# Patient Record
Sex: Male | Born: 1955
Health system: Southern US, Community
[De-identification: ages and names within clinical notes are randomized; demographics above are authoritative.]

## PROBLEM LIST (undated history)

## (undated) HISTORY — PX: HERNIA REPAIR: SHX51

---

## 2007-10-29 ENCOUNTER — Emergency Department (HOSPITAL_COMMUNITY): Admission: EM | Admit: 2007-10-29 | Discharge: 2007-10-29 | Payer: Self-pay | Admitting: Emergency Medicine

## 2008-03-25 ENCOUNTER — Encounter: Admission: RE | Admit: 2008-03-25 | Discharge: 2008-03-25 | Payer: Self-pay | Admitting: Internal Medicine

## 2011-06-28 ENCOUNTER — Emergency Department: Payer: Self-pay | Admitting: Unknown Physician Specialty

## 2011-06-28 LAB — COMPREHENSIVE METABOLIC PANEL
Calcium, Total: 8.9 mg/dL (ref 8.5–10.1)
EGFR (Non-African Amer.): 34 — ABNORMAL LOW
Glucose: 81 mg/dL (ref 65–99)
SGOT(AST): 27 U/L (ref 15–37)
SGPT (ALT): 23 U/L
Total Protein: 8.1 g/dL (ref 6.4–8.2)

## 2011-06-28 LAB — URINALYSIS, COMPLETE
Bacteria: NONE SEEN
Cellular Cast: 1
Glucose,UR: NEGATIVE mg/dL (ref 0–75)
Hyaline Cast: 36
Ketone: NEGATIVE
Leukocyte Esterase: NEGATIVE
RBC,UR: 1 /HPF (ref 0–5)
Squamous Epithelial: 1
WBC UR: 2 /HPF (ref 0–5)

## 2011-06-28 LAB — CBC
HCT: 48.2 % (ref 40.0–52.0)
Platelet: 308 10*3/uL (ref 150–440)
RBC: 5.17 10*6/uL (ref 4.40–5.90)

## 2011-06-28 LAB — CK TOTAL AND CKMB (NOT AT ARMC): CK, Total: 403 U/L — ABNORMAL HIGH (ref 35–232)

## 2015-10-29 ENCOUNTER — Emergency Department: Payer: Self-pay

## 2015-10-29 ENCOUNTER — Inpatient Hospital Stay
Admission: EM | Admit: 2015-10-29 | Discharge: 2015-10-30 | DRG: 871 | Disposition: A | Discharge status: Home / Self Care | Payer: Self-pay | Attending: Internal Medicine | Admitting: Internal Medicine

## 2015-10-29 ENCOUNTER — Encounter: Payer: Self-pay | Admitting: Urgent Care

## 2015-10-29 DIAGNOSIS — Z9889 Other specified postprocedural states: Secondary | ICD-10-CM

## 2015-10-29 DIAGNOSIS — F17210 Nicotine dependence, cigarettes, uncomplicated: Secondary | ICD-10-CM | POA: Diagnosis present

## 2015-10-29 DIAGNOSIS — A419 Sepsis, unspecified organism: Principal | ICD-10-CM | POA: Diagnosis present

## 2015-10-29 DIAGNOSIS — R079 Chest pain, unspecified: Secondary | ICD-10-CM

## 2015-10-29 DIAGNOSIS — Z716 Tobacco abuse counseling: Secondary | ICD-10-CM

## 2015-10-29 DIAGNOSIS — J181 Lobar pneumonia, unspecified organism: Secondary | ICD-10-CM

## 2015-10-29 DIAGNOSIS — I2699 Other pulmonary embolism without acute cor pulmonale: Secondary | ICD-10-CM | POA: Diagnosis present

## 2015-10-29 DIAGNOSIS — R0602 Shortness of breath: Secondary | ICD-10-CM

## 2015-10-29 DIAGNOSIS — R0682 Tachypnea, not elsewhere classified: Secondary | ICD-10-CM | POA: Diagnosis present

## 2015-10-29 DIAGNOSIS — J189 Pneumonia, unspecified organism: Secondary | ICD-10-CM | POA: Diagnosis present

## 2015-10-29 LAB — URINE DRUG SCREEN, QUALITATIVE (ARMC ONLY)
Amphetamines, Ur Screen: NOT DETECTED
Barbiturates, Ur Screen: NOT DETECTED
Benzodiazepine, Ur Scrn: NOT DETECTED
CANNABINOID 50 NG, UR ~~LOC~~: NOT DETECTED
Cocaine Metabolite,Ur ~~LOC~~: POSITIVE — AB
MDMA (ECSTASY) UR SCREEN: NOT DETECTED
Methadone Scn, Ur: NOT DETECTED
OPIATE, UR SCREEN: POSITIVE — AB
PHENCYCLIDINE (PCP) UR S: NOT DETECTED
Tricyclic, Ur Screen: NOT DETECTED

## 2015-10-29 LAB — FIBRIN DERIVATIVES D-DIMER (ARMC ONLY): FIBRIN DERIVATIVES D-DIMER (ARMC): 3824 — AB (ref 0–499)

## 2015-10-29 LAB — CBC WITH DIFFERENTIAL/PLATELET
BASOS ABS: 0 10*3/uL (ref 0–0.1)
BASOS PCT: 0 %
EOS ABS: 0.2 10*3/uL (ref 0–0.7)
Eosinophils Relative: 1 %
HCT: 39.4 % — ABNORMAL LOW (ref 40.0–52.0)
HEMOGLOBIN: 13.6 g/dL (ref 13.0–18.0)
Lymphocytes Relative: 12 %
Lymphs Abs: 1.8 10*3/uL (ref 1.0–3.6)
MCH: 31.4 pg (ref 26.0–34.0)
MCHC: 34.6 g/dL (ref 32.0–36.0)
MCV: 90.8 fL (ref 80.0–100.0)
MONOS PCT: 12 %
Monocytes Absolute: 1.8 10*3/uL — ABNORMAL HIGH (ref 0.2–1.0)
NEUTROS ABS: 11.1 10*3/uL — AB (ref 1.4–6.5)
NEUTROS PCT: 75 %
PLATELETS: 240 10*3/uL (ref 150–440)
RBC: 4.34 MIL/uL — ABNORMAL LOW (ref 4.40–5.90)
RDW: 13.7 % (ref 11.5–14.5)
WBC: 15 10*3/uL — AB (ref 3.8–10.6)

## 2015-10-29 LAB — COMPREHENSIVE METABOLIC PANEL
ALBUMIN: 3.5 g/dL (ref 3.5–5.0)
ALT: 12 U/L — ABNORMAL LOW (ref 17–63)
ANION GAP: 6 (ref 5–15)
AST: 19 U/L (ref 15–41)
Alkaline Phosphatase: 62 U/L (ref 38–126)
BILIRUBIN TOTAL: 0.2 mg/dL — AB (ref 0.3–1.2)
BUN: 8 mg/dL (ref 6–20)
CALCIUM: 8.5 mg/dL — AB (ref 8.9–10.3)
CO2: 25 mmol/L (ref 22–32)
Chloride: 108 mmol/L (ref 101–111)
Creatinine, Ser: 1.05 mg/dL (ref 0.61–1.24)
Glucose, Bld: 114 mg/dL — ABNORMAL HIGH (ref 65–99)
POTASSIUM: 4 mmol/L (ref 3.5–5.1)
Sodium: 139 mmol/L (ref 135–145)
TOTAL PROTEIN: 6.5 g/dL (ref 6.5–8.1)

## 2015-10-29 LAB — TROPONIN I: Troponin I: <0.03 ng/mL

## 2015-10-29 MED ORDER — IOPAMIDOL (ISOVUE-370) INJECTION 76%
75.0000 mL | Freq: Once | INTRAVENOUS | Status: AC | PRN
  Administered 2015-10-29: 75 mL via INTRAVENOUS

## 2015-10-29 MED ORDER — ACETAMINOPHEN 325 MG PO TABS: 650.0000 mg | ORAL_TABLET | Freq: Four times a day (QID) | ORAL | Status: DC | PRN

## 2015-10-29 MED ORDER — APIXABAN 5 MG PO TABS
10.0000 mg | ORAL_TABLET | Freq: Two times a day (BID) | ORAL | Status: DC
  Administered 2015-10-29 – 2015-10-30 (×3): 10 mg via ORAL
  Filled 2015-10-29 (×3): qty 2

## 2015-10-29 MED ORDER — PNEUMOCOCCAL VAC POLYVALENT 25 MCG/0.5ML IJ INJ: 0.5000 mL | INJECTION | INTRAMUSCULAR | Status: DC

## 2015-10-29 MED ORDER — APIXABAN 5 MG PO TABS: 5.0000 mg | ORAL_TABLET | Freq: Two times a day (BID) | ORAL | Status: DC

## 2015-10-29 MED ORDER — DEXTROSE 5 % IV SOLN
1.0000 g | INTRAVENOUS | Status: DC
  Administered 2015-10-30: 1 g via INTRAVENOUS
  Filled 2015-10-29: qty 10

## 2015-10-29 MED ORDER — HYDROCODONE-ACETAMINOPHEN 5-325 MG PO TABS
1.0000 | ORAL_TABLET | ORAL | Status: DC | PRN
  Administered 2015-10-29 (×2): 2 via ORAL
  Filled 2015-10-29 (×2): qty 2

## 2015-10-29 MED ORDER — SODIUM CHLORIDE 0.9% FLUSH
3.0000 mL | Freq: Two times a day (BID) | INTRAVENOUS | Status: DC
  Administered 2015-10-29 – 2015-10-30 (×3): 3 mL via INTRAVENOUS

## 2015-10-29 MED ORDER — ONDANSETRON HCL 4 MG PO TABS: 4.0000 mg | ORAL_TABLET | Freq: Four times a day (QID) | ORAL | Status: DC | PRN

## 2015-10-29 MED ORDER — ACETAMINOPHEN 650 MG RE SUPP: 650.0000 mg | Freq: Four times a day (QID) | RECTAL | Status: DC | PRN

## 2015-10-29 MED ORDER — CEFTRIAXONE SODIUM IN DEXTROSE 20 MG/ML IV SOLN
1.0000 g | Freq: Once | INTRAVENOUS | Status: AC
  Administered 2015-10-29: 1 g via INTRAVENOUS
  Filled 2015-10-29: qty 50

## 2015-10-29 MED ORDER — INFLUENZA VAC SPLIT QUAD 0.5 ML IM SUSY: 0.5000 mL | PREFILLED_SYRINGE | INTRAMUSCULAR | Status: DC

## 2015-10-29 MED ORDER — MORPHINE SULFATE (PF) 4 MG/ML IV SOLN
4.0000 mg | Freq: Once | INTRAVENOUS | Status: AC
Start: 2015-10-29 — End: 2015-10-29
  Administered 2015-10-29: 4 mg via INTRAVENOUS
  Filled 2015-10-29: qty 1

## 2015-10-29 MED ORDER — MAGNESIUM HYDROXIDE 400 MG/5ML PO SUSP
30.0000 mL | Freq: Every day | ORAL | Status: DC | PRN
Start: 2015-10-29 — End: 2015-10-30

## 2015-10-29 MED ORDER — DEXTROSE 5 % IV SOLN
500.0000 mg | Freq: Once | INTRAVENOUS | Status: AC
  Administered 2015-10-29: 500 mg via INTRAVENOUS
  Filled 2015-10-29: qty 500

## 2015-10-29 MED ORDER — DEXTROSE 5 % IV SOLN
500.0000 mg | INTRAVENOUS | Status: DC
  Administered 2015-10-30: 500 mg via INTRAVENOUS
  Filled 2015-10-29: qty 500

## 2015-10-29 MED ORDER — SODIUM CHLORIDE 0.9 % IV BOLUS (SEPSIS)
500.0000 mL | Freq: Once | INTRAVENOUS | Status: AC
  Administered 2015-10-29: 500 mL via INTRAVENOUS

## 2015-10-29 MED ORDER — ONDANSETRON HCL 4 MG/2ML IJ SOLN: 4.0000 mg | Freq: Four times a day (QID) | INTRAMUSCULAR | Status: DC | PRN

## 2015-10-29 MED ORDER — ONDANSETRON HCL 4 MG/2ML IJ SOLN
4.0000 mg | Freq: Once | INTRAMUSCULAR | Status: AC
  Administered 2015-10-29: 4 mg via INTRAVENOUS
  Filled 2015-10-29: qty 2

## 2015-10-29 MED ORDER — SODIUM CHLORIDE 0.9 % IV BOLUS (SEPSIS)
1000.0000 mL | Freq: Once | INTRAVENOUS | Status: AC
  Administered 2015-10-29: 1000 mL via INTRAVENOUS

## 2015-10-29 NOTE — Consult Note (Signed)
ANTICOAGULATION CONSULT NOTE - Initial Consult  Pharmacy Consult for apixaban Indication: pulmonary embolus  No Known Allergies  Patient Measurements: Height: 6\' 1"  (185.4 cm) Weight: 165 lb (74.8 kg) IBW/kg (Calculated) : 79.9 Heparin Dosing Weight:   Vital Signs: Temp: 98.3 F (36.8 C) (10/14 0554) Temp Source: Oral (10/14 0554) BP: 126/82 (10/14 1100) Pulse Rate: 68 (10/14 1100)  Labs:  Recent Labs  10/29/15 0541  HGB 13.6  HCT 39.4*  PLT 240  CREATININE 1.05  TROPONINI <0.03    Estimated Creatinine Clearance: 77.2 mL/min (by C-G formula based on SCr of 1.05 mg/dL).   Medical History: History reviewed. No pertinent past medical history.  Medications:  Scheduled:  . apixaban  10 mg Oral BID   Followed by  . [START ON 11/05/2015] apixaban  5 mg Oral BID  . sodium chloride flush  3 mL Intravenous Q12H    Assessment: Pt is a 60 year old male who presents with SOB found to have PNA and a small PE on CT scan Goal of Therapy:   Monitor platelets by anticoagulation protocol: Yes   Plan:  apixaban 10mg  BID x 7 days then 5mg  BID.  Check CBC/SCr q 3 days while inpatient.  Olene FlossMelissa D Vena Bassinger, Pharm.D Clinical Pharmacist  10/29/2015,11:18 AM

## 2015-10-29 NOTE — ED Notes (Signed)
Iv present on this RN's intial assessment

## 2015-10-29 NOTE — ED Notes (Signed)
Lab resulting at this time. D-dimer elevated; MD made aware. Inocencio HomesGayle, MD electing not to pursuing a CT scan due to patient having a LLL PNA and due to the fact that he has been coughing over the course of the last few days. Patient to be treated for CAP; will forgo the CT scan.

## 2015-10-29 NOTE — ED Triage Notes (Signed)
Patient presents to ED via EMS from home. Patient with c/o LEFT chest wall pain that began yesterday; worse this morning. (+) recent cough reported. Patient is guarding; pain reproducible with movement and deep inspiration. Patient endorses recent crack cocaine use x 3-4 days ago.

## 2015-10-29 NOTE — Progress Notes (Signed)
Pt stated to RN that he is having left sided chest pain that he felt earlier in the ED, VSS. RN notified Dr. Juliene PinaMody about pt.'s current condition, new orders to place troponin lab draw once and to give PRN Norco to pt at this time. Will continue to monitor pt closely.   Karsten RoLauren E Hobbs

## 2015-10-29 NOTE — H&P (Signed)
[facility] [place] [facility]   PATIENT NAME: Joshua Clay    MR#:  [id number]  DATE OF BIRTH:  01/07/1956  DATE OF ADMISSION:  10/29/2015  PRIMARY CARE PHYSICIAN: No primary care provider on file.   REQUESTING/REFERRING PHYSICIAN: Dr Darnelle CatalanMalinda  CHIEF COMPLAINT:    Shortness of breath HISTORY OF PRESENT ILLNESS:  Joshua Clay  is a 60 y.o. male with History of tobacco dependence who presents with above complaint. Patient presented past 2 days he has had increasing shortness of breath and chest pain on the left side. He denies fever, cough or shortness of breath. He denies recent travel history or lower extremity edema. In the ER he is found to have pneumonia as well as small pulmonary emboli and CT scan.  PAST MEDICAL HISTORY:  Tobacco dependence  PAST SURGICAL HISTORY:   Past Surgical History:  Procedure Laterality Date  . HERNIA REPAIR      SOCIAL HISTORY:   Social History  Substance Use Topics  . Smoking status: Current Every Day Smoker  . Smokeless tobacco: Never Used  . Alcohol use No    FAMILY HISTORY:  No history of CVA or pulmonary emboli  DRUG ALLERGIES:  No Known Allergies  REVIEW OF SYSTEMS:   Review of Systems  Constitutional: Negative.  Negative for chills, fever and malaise/fatigue.  HENT: Negative.  Negative for ear discharge, ear pain, hearing loss, nosebleeds and sore throat.   Eyes: Negative.  Negative for blurred vision and pain.  Respiratory: Positive for cough and shortness of breath. Negative for hemoptysis and wheezing.   Cardiovascular: Positive for chest pain. Negative for palpitations and leg swelling.  Gastrointestinal: Negative.  Negative for abdominal pain, blood in stool, diarrhea, nausea and vomiting.  Genitourinary: Negative.  Negative for dysuria.  Musculoskeletal: Negative.  Negative for back pain.  Skin: Negative.   Neurological: Negative for dizziness, tremors, speech change, focal weakness, seizures  and headaches.  Endo/Heme/Allergies: Negative.  Does not bruise/bleed easily.  Psychiatric/Behavioral: Negative.  Negative for depression, hallucinations and suicidal ideas.    MEDICATIONS AT HOME:   Prior to Admission medications   Medication Sig Start Date End Date Taking? Authorizing Provider  acetaminophen (TYLENOL) 500 MG tablet Take 500 mg by mouth every 6 (six) hours as needed.   Yes Historical Provider, MD      VITAL SIGNS:  Blood pressure (!) 141/99, pulse 87, temperature 98.3 F (36.8 C), temperature source Oral, resp. rate (!) 26, height 6\' 1"  (1.854 m), weight 74.8 kg (165 lb), SpO2 95 %.  PHYSICAL EXAMINATION:   Physical Exam  Constitutional: He is oriented to person, place, and time and well-developed, well-nourished, and in no distress. No distress.  HENT:  Head: Normocephalic.  Eyes: No scleral icterus.  Neck: Normal range of motion. Neck supple. No JVD present. No tracheal deviation present.  Cardiovascular: Normal rate, regular rhythm and normal heart sounds.  Exam reveals no gallop and no friction rub.   No murmur heard. Pulmonary/Chest: Effort normal and breath sounds normal. No respiratory distress. He has no wheezes. He has no rales. He exhibits no tenderness.  Abdominal: Soft. Bowel sounds are normal. He exhibits no distension and no mass. There is no tenderness. There is no rebound and no guarding.  Musculoskeletal: Normal range of motion. He exhibits no edema.  Neurological: He is alert and oriented to person, place, and time.  Skin: Skin is warm. No rash noted. No erythema.  Psychiatric: Affect and judgment normal.  LABORATORY PANEL:   CBC  Recent Labs Lab 10/29/15 0541  WBC 15.0*  HGB 13.6  HCT 39.4*  PLT 240   ------------------------------------------------------------------------------------------------------------------  Chemistries   Recent Labs Lab 10/29/15 0541  NA 139  K 4.0  CL 108  CO2 25  GLUCOSE 114*  BUN 8   CREATININE 1.05  CALCIUM 8.5*  AST 19  ALT 12*  ALKPHOS 62  BILITOT 0.2*   ------------------------------------------------------------------------------------------------------------------  Cardiac Enzymes  Recent Labs Lab 10/29/15 0541  TROPONINI <0.03   ------------------------------------------------------------------------------------------------------------------  RADIOLOGY:  Dg Chest 2 View  Result Date: 10/29/2015 CLINICAL DATA:  Left-sided chest pain.  Shortness of breath.  Cough. EXAM: CHEST  2 VIEW COMPARISON:  03/25/2008 FINDINGS: Peripheral left lung base opacity with small left pleural effusion. Minimal lingular opacity. Right lung is clear. Normal heart size and mediastinal contours. No pulmonary edema or pneumothorax. No acute osseous abnormality. IMPRESSION: Left lower lobe and lingular opacity with small left pleural effusion. Electronically Signed   By: Rubye Oaks M.D.   On: 10/29/2015 06:14   Ct Angio Chest Pe W Or Wo Contrast  Result Date: 10/29/2015 CLINICAL DATA:  60 year old male with a history of left-sided chest pain and shortness of breath starting 2 days prior. EXAM: CT ANGIOGRAPHY CHEST WITH CONTRAST TECHNIQUE: Multidetector CT imaging of the chest was performed using the standard protocol during bolus administration of intravenous contrast. Multiplanar CT image reconstructions and MIPs were obtained to evaluate the vascular anatomy. CONTRAST:  75 cc Isovue 370 COMPARISON:  None. FINDINGS: Cardiovascular: Heart: No cardiomegaly. No pericardial fluid/thickening. No significant coronary calcifications. Aorta: Unremarkable course, caliber, contour of the thoracic aorta. No aneurysm or dissection flap. No periaortic fluid. Minimal soft plaque with no calcifications. Pulmonary arteries: Small volume segmental and subsegmental pulmonary emboli the bilateral lower lobes. Minimal subsegmental volume of the upper lobes. Mediastinum/Nodes: Mediastinal lymph nodes  are present, none of which are enlarged by CT size criteria. Unremarkable appearance of the thoracic esophagus. Unremarkable appearance of the thoracic inlet and thyroid. Lungs/Pleura: Mixed ground-glass and nodular opacity of the left lower lobe with associated small pleural effusion. Atelectasis/scarring of the lingula. Nodular opacity at the right base within the costophrenic sulcus with associated ground-glass opacity. No pneumothorax. Upper Abdomen: Unremarkable. Musculoskeletal: No displaced fracture. Degenerative changes of the spine. Review of the MIP images confirms the above findings. IMPRESSION: Study is positive for small volume bilateral pulmonary emboli involving segmental and subsegmental vessels of mainly the lower lobes. There is associated mixed ground-glass and nodular opacities of the lungs compatible with developing atelectasis/infarction, with small left-sided pleural effusion. These results were called by telephone at the time of interpretation on 10/29/2015 at 8:55 am to Dr. Darnelle Catalan, who verbally acknowledged these results. Signed, Yvone Neu. Loreta Ave, DO [facility] Radiology Electronically Signed   By: Gilmer Mor D.O.   On: 10/29/2015 08:59    EKG:   Normal sinus rhythm heart rate 82 no ST elevation or depression  IMPRESSION AND PLAN:   60 year old male with a history of tobacco dependence who presents with cough, shortness of breath and chest pain and found to have pneumonia as well as bilateral pulmonary emboli.  1. Sepsis: Patient presents with tachypnea and leukocytosis. Sepsis is due to pneumonia.  2. Community-acquired pneumonia: Continue Rocephin and azithromycin.  3. Bilateral pulmonary emboli involving segmental and subsegmental vessels of mainly lower lobes: Start anticoagulation with Eliquis. Side effects, alternatives, risks and benefits were discussed with patient who accepts risks.  4. Tobacco is: Patient is  encouraged to stop smoking. He reports he wants to quit smoking. Patient counseled for 3 minutes. All the records are reviewed and case discussed with ED provider. Management plans discussed with the patient and he in agreement  CODE STATUS: full  TOTAL TIME TAKING CARE OF THIS PATIENT: 45 minutes.    Claire Bridge M.D on 10/29/2015 at 10:54 AM  Between 7am to 6pm - Pager - [phone]  After 6pm go to [email or web address] - Social research officer, government  [facility]  Office  [phone]  CC: Primary care physician; No primary care provider on file.

## 2015-10-29 NOTE — ED Notes (Signed)
Patient much more comfortable following Morphine dose. Dr. Inocencio HomesGayle returns to bedside to speak with patient regarding labs and CXR results.

## 2015-10-29 NOTE — ED Provider Notes (Addendum)
[facility] Emergency Department Provider Note   ____________________________________________   First MD Initiated Contact with Patient 10/29/15 [phone]     (approximate)  I have reviewed the triage vital signs and the nursing notes.   HISTORY  Chief Complaint Chest Pain and Shortness of Breath    HPI Joshua Clay is a 60 y.o. male with no chronic medical problems presents for evaluation of severe left-sided chest pain since yesterday, gradual onset, constant, worse with cough and deep inspiration. He has had productive cough as well as fevers and chills. Pain does not radiate to the back or down towards the feet. No vomiting or diarrhea.He denies history of DVT or PE.   History reviewed. No pertinent past medical history.  There are no active problems to display for this patient.   Past Surgical History:  Procedure Laterality Date  . HERNIA REPAIR      Prior to Admission medications   Not on File    Allergies Review of patient's allergies indicates no known allergies.  No family history on file.  Social History Social History  Substance Use Topics  . Smoking status: Current Every Day Smoker  . Smokeless tobacco: Never Used  . Alcohol use No    Review of Systems Constitutional: + fever/chills Eyes: No visual changes. ENT: No sore throat. Cardiovascular: + chest pain. Respiratory: + shortness of breath. Gastrointestinal: No abdominal pain.  No nausea, no vomiting.  No diarrhea.  No constipation. Genitourinary: Negative for dysuria. Musculoskeletal: Negative for back pain. Skin: Negative for rash. Neurological: Negative for headaches, focal weakness or numbness.  10-point ROS otherwise negative.  ____________________________________________   PHYSICAL EXAM:  VITAL SIGNS: ED Triage Vitals  Enc Vitals Group     BP 10/29/15 0554 (!) 141/88     Pulse Rate 10/29/15 0554 78     Resp 10/29/15 0554 (!) 22     Temp 10/29/15  0554 98.3 F (36.8 C)     Temp Source 10/29/15 0554 Oral     SpO2 10/29/15 0554 93 %     Weight 10/29/15 0555 165 lb (74.8 kg)     Height 10/29/15 0555 6\' 1"  (1.854 m)     Head Circumference --      Peak Flow --      Pain Score 10/29/15 0555 10     Pain Loc --      Pain Edu? --      Excl. in GC? --     Constitutional: Alert and oriented.In severe distress secondary to pain. Eyes: Conjunctivae are normal. PERRL. EOMI. Head: Atraumatic. Nose: No congestion/rhinnorhea. Mouth/Throat: Mucous membranes are moist.  Oropharynx non-erythematous. Neck: No stridor. Supple without meningismus. Cardiovascular: Normal rate, regular rhythm. Grossly normal heart sounds.  Good peripheral circulation. Respiratory: Tachypnea with mildly increased work of breathing, diminished breath sounds in the left lower lung fields. Gastrointestinal: Soft and nontender. No distention.  No CVA tenderness. Genitourinary: deferred Musculoskeletal: No lower extremity tenderness nor edema.  No joint effusions. Neurologic:  Normal speech and language. No gross focal neurologic deficits are appreciated. No gait instability. Skin:  Skin is warm, dry and intact. No rash noted. Psychiatric: Mood and affect are normal. Speech and behavior are normal.  ____________________________________________   LABS (all labs ordered are listed, but only abnormal results are displayed)  Labs Reviewed  COMPREHENSIVE METABOLIC PANEL - Abnormal; Notable for the following:       Result Value   Glucose, Bld 114 (*)    Calcium 8.5 (*)  ALT 12 (*)    Total Bilirubin 0.2 (*)    All other components within normal limits  FIBRIN DERIVATIVES D-DIMER ([facility] ONLY) - Abnormal; Notable for the following:    Fibrin derivatives D-dimer [facility]) 3,824 (*)    All other components within normal limits  CBC WITH DIFFERENTIAL/PLATELET - Abnormal; Notable for the following:    WBC 15.0 (*)    RBC 4.34 (*)    HCT 39.4 (*)    Neutro Abs 11.1 (*)     Monocytes Absolute 1.8 (*)    All other components within normal limits  CULTURE, BLOOD (ROUTINE X 2)  CULTURE, BLOOD (ROUTINE X 2)  TROPONIN I  URINE DRUG SCREEN, QUALITATIVE ([facility] ONLY)   ____________________________________________  EKG  ED ECG REPORT I, Gayla Doss, the attending physician, personally viewed and interpreted this ECG.   Date: 10/29/2015  EKG Time: 05:41  Rate: 82  Rhythm: normal sinus rhythm  Axis: normal  Intervals:none  ST&T Change: No acute ST elevation or acute ST depression. T-wave flattening in leads 3 and V1 is nonspecific.  ____________________________________________  RADIOLOGY  CXR FINDINGS:  Peripheral left lung base opacity with small left pleural effusion.  Minimal lingular opacity. Right lung is clear. Normal heart size and  mediastinal contours. No pulmonary edema or pneumothorax. No acute  osseous abnormality.    IMPRESSION:  Left lower lobe and lingular opacity with small left pleural  effusion.    ____________________________________________   PROCEDURES  Procedure(s) performed: None  Procedures  Critical Care performed: No  ____________________________________________   INITIAL IMPRESSION / ASSESSMENT AND PLAN / ED COURSE  Pertinent labs & imaging results that were available during my care of the patient were reviewed by me and considered in my medical decision making (see chart for details).  Joshua Clay is a 60 y.o. male with no chronic medical problems presents for evaluation of severe left-sided chest pain as well as cough and shortness of breath. On arrival to the emergency department he is in severe distress secondary to pain. He is tachypneic with increased work of breathing has diminished breath sounds in the left lung fields. We'll obtain screening labs, treat him symptomatically, obtain chest x-ray. Given the pleuritic nature of his pain and his age, cannot use perc rule, we'll obtain d-dimer.  Reassuring, not consistent with acute ischemia.  ----------------------------------------- 7:18 AM on 10/29/2015 ----------------------------------------- Labs reviewed and are notable for leukocytosis, CBC shows white blood cell count of 15. CMP generally unremarkable. Negative troponin. Chest x-ray confirms left lingular and left lower lobe opacity. Clinical picture is most consistent with community acquired pneumonia. His pain is improved at this time however while he is resting, his respiratory rate is still 27 respirations per minute and he gets very short of breath with minimal exertion. Given his leukocytosis and his persistent tachypnea, I ordered blood cultures, fluids, ceftriaxone and azithromycin for treatment of community acquired pneumonia. I discussed the case with Dr. Tobi Bastos for admission at this time. Of note, his d-dimer was elevated but his clinical picture is much more consistent with pneumonia as opposed to PE so I will not order a CT angio chest at this time. If his symptoms worsen or he is not responding appropriately to therapy, this can be ordered at the discretion of the inpatient/admitting team.   Clinical Course     ____________________________________________   FINAL CLINICAL IMPRESSION(S) / ED DIAGNOSES  Final diagnoses:  Chest pain, unspecified type  Community acquired pneumonia of left lower lobe of  lung (HCC)      NEW MEDICATIONS STARTED DURING THIS VISIT:  New Prescriptions   No medications on file     Note:  This document was prepared using Dragon voice recognition software and may include unintentional dictation errors.    Gayla Doss, MD 10/29/15 1610    Gayla Doss, MD 10/29/15 [phone]

## 2015-10-29 NOTE — ED Notes (Signed)
Patient to CT.

## 2015-10-30 LAB — CBC
HCT: 37.6 % — ABNORMAL LOW (ref 40.0–52.0)
HEMOGLOBIN: 12.9 g/dL — AB (ref 13.0–18.0)
MCH: 31.5 pg (ref 26.0–34.0)
MCHC: 34.3 g/dL (ref 32.0–36.0)
MCV: 92 fL (ref 80.0–100.0)
Platelets: 231 10*3/uL (ref 150–440)
RBC: 4.08 MIL/uL — ABNORMAL LOW (ref 4.40–5.90)
RDW: 13.8 % (ref 11.5–14.5)
WBC: 9.3 10*3/uL (ref 3.8–10.6)

## 2015-10-30 LAB — BASIC METABOLIC PANEL
ANION GAP: 5 (ref 5–15)
BUN: 7 mg/dL (ref 6–20)
CALCIUM: 8.2 mg/dL — AB (ref 8.9–10.3)
CO2: 25 mmol/L (ref 22–32)
Chloride: 108 mmol/L (ref 101–111)
Creatinine, Ser: 0.96 mg/dL (ref 0.61–1.24)
GLUCOSE: 142 mg/dL — AB (ref 65–99)
Potassium: 3.5 mmol/L (ref 3.5–5.1)
SODIUM: 138 mmol/L (ref 135–145)

## 2015-10-30 MED ORDER — APIXABAN 5 MG PO TABS: 10.0000 mg | ORAL_TABLET | Freq: Two times a day (BID) | ORAL | 0 refills | Status: AC

## 2015-10-30 MED ORDER — LEVOFLOXACIN 750 MG PO TABS: 750.0000 mg | ORAL_TABLET | Freq: Every day | ORAL | 0 refills | Status: AC

## 2015-10-30 MED ORDER — NICOTINE 21 MG/24HR TD PT24: 21.0000 mg | MEDICATED_PATCH | Freq: Every day | TRANSDERMAL | 0 refills | Status: AC

## 2015-10-30 MED ORDER — APIXABAN 5 MG PO TABS: 10.0000 mg | ORAL_TABLET | Freq: Two times a day (BID) | ORAL | 0 refills | Status: DC

## 2015-10-30 MED ORDER — AZITHROMYCIN 250 MG PO TABS: 500.0000 mg | ORAL_TABLET | Freq: Every day | ORAL | Status: DC

## 2015-10-30 MED ORDER — APIXABAN 5 MG PO TABS: 5.0000 mg | ORAL_TABLET | Freq: Two times a day (BID) | ORAL | 0 refills | Status: DC

## 2015-10-30 MED ORDER — APIXABAN 5 MG PO TABS: 5.0000 mg | ORAL_TABLET | Freq: Two times a day (BID) | ORAL | 0 refills | Status: AC

## 2015-10-30 NOTE — Progress Notes (Signed)
PHARMACIST - PHYSICIAN COMMUNICATION DR:   Juliene PinaMody CONCERNING: Antibiotic IV to Oral Route Change Policy  RECOMMENDATION: This patient is receiving azithromycin by the intravenous route.  Based on criteria approved by the Pharmacy and Therapeutics Committee, the antibiotic(s) is/are being converted to the equivalent oral dose form(s).   DESCRIPTION: These criteria include:  Patient being treated for a respiratory tract infection, urinary tract infection, cellulitis or clostridium difficile associated diarrhea if on metronidazole  The patient is not neutropenic and does not exhibit a GI malabsorption state  The patient is eating (either orally or via tube) and/or has been taking other orally administered medications for a least 24 hours  The patient is improving clinically and has a Tmax < 100.5  If you have questions about this conversion, please contact the Pharmacy Department  []   [phone]( [phone] )  [facility] [x]   [phone]( [phone] )  [facility] []   [phone]( [phone] )  [facility] []   [phone]( [phone] )  [facility] []   [phone]( [phone] )  [facility]   Luisa HartScott Hallie Ertl, PharmD Clinical Pharmacist

## 2015-10-30 NOTE — Care Management Note (Signed)
Case Management Note  Patient Details  Name: Joshua Clay MRN: [id number] Date of Birth: 08/13/1955  Subjective/Objective:       Case management is providing Joshua Clay with discount coupons for Eliquis and for Levofloxacin to take to a local pharmacy.              Action/Plan:   Expected Discharge Date:                  Expected Discharge Plan:     In-House Referral:     Discharge planning Services     Post Acute Care Choice:    Choice offered to:     DME Arranged:    DME Agency:     HH Arranged:    HH Agency:     Status of Service:     If discussed at [employer] Length of Stay Meetings, dates discussed:    Additional Comments:  Jaymee Tilson A, RN 10/30/2015, 9:09 AM

## 2015-10-30 NOTE — Progress Notes (Signed)
Per Dr. Juliene PinaMody do not give patient his flu or pneumonia shot as he was admitted for pneumonia. Patinet can followup with immunizations at [facility]

## 2015-10-30 NOTE — Discharge Summary (Signed)
[facility] [place] [facility]   PATIENT NAME: Joshua Clay    MR#:  [id number]  DATE OF BIRTH:  [birth year]  DATE OF ADMISSION:  10/29/2015 ADMITTING PHYSICIAN: Adrian Saran, MD  DATE OF DISCHARGE: 10/30/2015  PRIMARY CARE PHYSICIAN: No primary care provider on file.    ADMISSION DIAGNOSIS:  SOB (shortness of breath) [R06.02] Chest pain, unspecified type [R07.9] Community acquired pneumonia of left lower lobe of lung (HCC) [J18.1]  DISCHARGE DIAGNOSIS:  Active Problems:   Pulmonary emboli (HCC)   SECONDARY DIAGNOSIS:  none  HOSPITAL COURSE:   60 year old male with history of tobacco dependence who presents with shortness of breath and put her chest pain and found to have pneumonia as well as bilateral PE. For further details please refer to H&P.  1. Sepsis: This is due to pneumonia. Patient presented with tachypnea and leukocytosis. Blood cultures are negative to date. Sepsis improved.  2. Community-acquired pneumonia: Patient was initially started on Rocephin and azithromycin. Blood cultures are negative. Patient will be discharged on Levaquin.  3. Bilateral pulmonary emboli involving segmental and subsegmental segmental vessels of mainly lower lobes: Patient was started on anticoagulation after side effects, alternatives, risks and benefits were discussed with with him in detail. Patient accepted meters. Patient has pleuritic chest pain due to the pneumonia and pulmonary emboli. He has no evidence of ACS as etiology of chest pain.  4. Tobacco abuse: Patient was counseled to stop smoking on admission. He'll be discharged nicotine patch.   DISCHARGE CONDITIONS AND DIET:   Stable for discharge home on regular diet  CONSULTS OBTAINED:    DRUG ALLERGIES:  No Known Allergies  DISCHARGE MEDICATIONS:   Current Discharge Medication List    START taking these medications   Details  !! apixaban (ELIQUIS) 5 MG TABS tablet Take 2 tablets (10 mg total)  by mouth 2 (two) times daily. Qty: 12 tablet, Refills: 0    !! apixaban (ELIQUIS) 5 MG TABS tablet Take 1 tablet (5 mg total) by mouth 2 (two) times daily. Qty: 60 tablet, Refills: 0    levofloxacin (LEVAQUIN) 750 MG tablet Take 1 tablet (750 mg total) by mouth daily. Qty: 5 tablet, Refills: 0    nicotine (NICODERM CQ) 21 mg/24hr patch Place 1 patch (21 mg total) onto the skin daily. Qty: 28 patch, Refills: 0     !! - Potential duplicate medications found. Please discuss with provider.    CONTINUE these medications which have NOT CHANGED   Details  acetaminophen (TYLENOL) 500 MG tablet Take 500 mg by mouth every 6 (six) hours as needed.              Today   CHIEF COMPLAINT:   Patient doing well this morning. Still has minor left-sided chest pain however much improved.   VITAL SIGNS:  Blood pressure 120/76, pulse 65, temperature 98.5 F (36.9 C), temperature source Oral, resp. rate 17, height 6\' 1"  (1.854 m), weight 74.8 kg (165 lb), SpO2 98 %.   REVIEW OF SYSTEMS:  Review of Systems  Constitutional: Negative.  Negative for chills, fever and malaise/fatigue.  HENT: Negative.  Negative for ear discharge, ear pain, hearing loss, nosebleeds and sore throat.   Eyes: Negative.  Negative for blurred vision and pain.  Respiratory: Negative.  Negative for cough, hemoptysis, shortness of breath and wheezing.   Cardiovascular: Positive for chest pain (from PE/PNA). Negative for palpitations and leg swelling.  Gastrointestinal: Negative.  Negative for abdominal pain, blood in stool, diarrhea,  nausea and vomiting.  Genitourinary: Negative.  Negative for dysuria.  Musculoskeletal: Negative.  Negative for back pain.  Skin: Negative.   Neurological: Negative for dizziness, tremors, speech change, focal weakness, seizures and headaches.  Endo/Heme/Allergies: Negative.  Does not bruise/bleed easily.  Psychiatric/Behavioral: Negative.  Negative for depression, hallucinations and  suicidal ideas.     PHYSICAL EXAMINATION:  GENERAL:  60 y.o.-year-old patient lying in the bed with no acute distress.  NECK:  Supple, no jugular venous distention. No thyroid enlargement, no tenderness.  LUNGS: Normal breath sounds bilaterally, no wheezing, rales,rhonchi  No use of accessory muscles of respiration.  CARDIOVASCULAR: S1, S2 normal. No murmurs, rubs, or gallops.  ABDOMEN: Soft, non-tender, non-distended. Bowel sounds present. No organomegaly or mass.  EXTREMITIES: No pedal edema, cyanosis, or clubbing.  PSYCHIATRIC: The patient is alert and oriented x 3.  SKIN: No obvious rash, lesion, or ulcer.   DATA REVIEW:   CBC  Recent Labs Lab 10/30/15 0337  WBC 9.3  HGB 12.9*  HCT 37.6*  PLT 231    Chemistries   Recent Labs Lab 10/29/15 0541 10/30/15 0337  NA 139 138  K 4.0 3.5  CL 108 108  CO2 25 25  GLUCOSE 114* 142*  BUN 8 7  CREATININE 1.05 0.96  CALCIUM 8.5* 8.2*  AST 19  --   ALT 12*  --   ALKPHOS 62  --   BILITOT 0.2*  --     Cardiac Enzymes  Recent Labs Lab 10/29/15 0541 10/29/15 1237  TROPONINI <0.03 <0.03    Microbiology Results  @MICRORSLT48 @  RADIOLOGY:  Dg Chest 2 View  Result Date: 10/29/2015 CLINICAL DATA:  Left-sided chest pain.  Shortness of breath.  Cough. EXAM: CHEST  2 VIEW COMPARISON:  03/25/2008 FINDINGS: Peripheral left lung base opacity with small left pleural effusion. Minimal lingular opacity. Right lung is clear. Normal heart size and mediastinal contours. No pulmonary edema or pneumothorax. No acute osseous abnormality. IMPRESSION: Left lower lobe and lingular opacity with small left pleural effusion. Electronically Signed   By: Rubye Oaks M.D.   On: 10/29/2015 06:14   Ct Angio Chest Pe W Or Wo Contrast  Result Date: 10/29/2015 CLINICAL DATA:  60 year old male with a history of left-sided chest pain and shortness of breath starting 2 days prior. EXAM: CT ANGIOGRAPHY CHEST WITH CONTRAST TECHNIQUE: Multidetector  CT imaging of the chest was performed using the standard protocol during bolus administration of intravenous contrast. Multiplanar CT image reconstructions and MIPs were obtained to evaluate the vascular anatomy. CONTRAST:  75 cc Isovue 370 COMPARISON:  None. FINDINGS: Cardiovascular: Heart: No cardiomegaly. No pericardial fluid/thickening. No significant coronary calcifications. Aorta: Unremarkable course, caliber, contour of the thoracic aorta. No aneurysm or dissection flap. No periaortic fluid. Minimal soft plaque with no calcifications. Pulmonary arteries: Small volume segmental and subsegmental pulmonary emboli the bilateral lower lobes. Minimal subsegmental volume of the upper lobes. Mediastinum/Nodes: Mediastinal lymph nodes are present, none of which are enlarged by CT size criteria. Unremarkable appearance of the thoracic esophagus. Unremarkable appearance of the thoracic inlet and thyroid. Lungs/Pleura: Mixed ground-glass and nodular opacity of the left lower lobe with associated small pleural effusion. Atelectasis/scarring of the lingula. Nodular opacity at the right base within the costophrenic sulcus with associated ground-glass opacity. No pneumothorax. Upper Abdomen: Unremarkable. Musculoskeletal: No displaced fracture. Degenerative changes of the spine. Review of the MIP images confirms the above findings. IMPRESSION: Study is positive for small volume bilateral pulmonary emboli involving segmental and subsegmental vessels of  mainly the lower lobes. There is associated mixed ground-glass and nodular opacities of the lungs compatible with developing atelectasis/infarction, with small left-sided pleural effusion. These results were called by telephone at the time of interpretation on 10/29/2015 at 8:55 am to Dr. Darnelle CatalanMalinda, who verbally acknowledged these results. Signed, Yvone NeuJaime S. Loreta AveWagner, DO [facility] Electronically Signed   By: Gilmer MorJaime  Wagner  D.O.   On: 10/29/2015 08:59      Management plans discussed with the patient and he is in agreement. Stable for discharge home  Round with nursing  [facility]   CODE STATUS:     Code Status Orders        Start     Ordered   10/29/15 1112  Full code  Continuous     10/29/15 1111    Code Status History    Date Active Date Inactive Code Status Order ID Comments User Context   This patient has a current code status but no historical code status.      TOTAL TIME TAKING CARE OF THIS PATIENT: 37  minutes.    Note: This dictation was prepared with Dragon dictation along with smaller phrase technology. Any transcriptional errors that result from this process are unintentional.  Wrenn Willcox M.D on 10/30/2015 at 10:00 AM  Between 7am to 6pm - Pager - [phone] After 6pm go to [email or web address] - Social research officer, governmentpassword [email or web address]  [facility]  Office  [phone]  CC: Primary care physician; No primary care provider on file.

## 2015-10-30 NOTE — Progress Notes (Signed)
Pt A and O x 4. VSS. Pt tolerating diet well. No complaints of pain or nausea. IV removed intact, prescriptions given. Pt voiced understanding of discharge instructions with no further questions. Pt discharged via wheelchair with nurse aide. Pt spoke with case manager about help with meds and [facility].

## 2015-10-30 NOTE — Progress Notes (Addendum)
Date: 10/30/2015,   MRN# [id number] Joshua Clay 06/19/1955 Code Status:     Code Status Orders        Start     Ordered   10/29/15 1112  Full code  Continuous     10/29/15 1111    Code Status History    Date Active Date Inactive Code Status Order ID Comments User Context   This patient has a current code status but no historical code status.     Hosp day:@LENGTHOFSTAYDAYS @ Referring MD: @ATDPROV @     PCP:      CC:   HPI: this is a 60 yo male, smoker came in c/o dyspnea and left side chest pain. On w/u noted to have bilateral emboli and possible pneumonia. No hemoptysis, leg pain, ectopy or syncope. No trauma, recent surgery or long distance travel. No steroids. Presently b/p intact. Asked to evaluate.    PMHX:   History reviewed. No pertinent past medical history. Surgical Hx:  Past Surgical History:  Procedure Laterality Date  . HERNIA REPAIR     Family Hx:  History reviewed. No pertinent family history. Social Hx:   Social History  Substance Use Topics  . Smoking status: Current Every Day Smoker    Packs/day: 1.00    Years: 15.00  . Smokeless tobacco: Never Used  . Alcohol use No   Medication:    Home Medication:  Current Outpatient Rx  . Order #: [id number] Class: Print  . [START ON 11/05/2015] Order #: [id number] Class: Print  . Order #: [id number] Class: Normal  . Order #: [id number] Class: Normal    Current Medication: @CURMEDTAB @   Allergies:  Review of patient's allergies indicates no known allergies.  Review of Systems: Gen:  Denies  fever, sweats, chills HEENT: Denies blurred vision, double vision, ear pain, eye pain, hearing loss, nose bleeds, sore throat Cvc:  No dizziness, chest pain or heaviness Resp:   No wheezing, hemotysis Gi: Denies swallowing difficulty, stomach pain, nausea or vomiting, diarrhea, constipation, bowel incontinence Gu:  Denies bladder incontinence, burning urine Ext:   No Joint pain, stiffness or swelling Skin: No skin  rash, easy bruising or bleeding or hives Endoc:  No polyuria, polydipsia , polyphagia or weight change Psych: No depression, insomnia or hallucinations  Other:  All other systems negative  Physical Examination:   VS: BP 125/76 (BP Location: Right Arm)   Pulse 66   Temp 98.2 F (36.8 C) (Oral)   Resp 19   Ht 6\' 1"  (1.854 m)   Wt 165 lb (74.8 kg)   SpO2 97%   BMI 21.77 kg/m   General Appearance: No distress  Neuro: without focal findings, mental status, speech normal, alert and oriented, cranial nerves 2-12 intact, reflexes normal and symmetric, sensation grossly normal  HEENT: PERRLA, EOM intact, no ptosis, no other lesions noticed,, multiple missing teeth Pulmonary:.No wheezing, No rales  Sputum Production:   Cardiovascular:  Normal S1,S2.  No m/r/g.  Abdominal aorta pulsation normal.    Abdomen:Benign, Soft, non-tender, No masses, hepatosplenomegaly, No lymphadenopathy Endoc: No evident thyromegaly, no signs of acromegaly or Cushing features Skin:   warm, no rashes, no ecchymosis  Extremities: normal, no cyanosis, clubbing, no edema, warm with normal capillary refill. Other findings:   Labs results:   Recent Labs     10/29/15  0541  10/30/15  0337  HGB  13.6  12.9*  HCT  39.4*  37.6*  MCV  90.8  92.0  WBC  15.0*  9.3  BUN  8  7  CREATININE  1.05  0.96  GLUCOSE  114*  142*  CALCIUM  8.5*  8.2*  ,    No results for input(s): PH in the last 72 hours.  Invalid input(s): PCO2, PO2, BASEEXCESS, BASEDEFICITE, TFT  Culture results:     Rad results:  IMPRESSION: Study is positive for small volume bilateral pulmonary emboli involving segmental and subsegmental vessels of mainly the lower lobes. There is associated mixed ground-glass and nodular opacities of the lungs compatible with developing atelectasis/infarction, with small left-sided pleural effusion.  These results were called by telephone at the time of interpretation on 10/29/2015 at 8:55 am to Dr. Darnelle Catalan,  who verbally acknowledged these results.  Assessment and Plan: Bilateral pulmonary embolism, not much dyspnea, blood pressure intact, no bleeding, on appropriate coverage. Stable to be d/c. Anticipate 6 months on treatment. Advised to stop smoking. Follow up with pcp.  Suspect he has copd due to smoking. Recommend out patient pfts and get off cigarettes.   I have personally obtained a history, examined the patient, evaluated laboratory and imaging results, formulated the assessment and plan and placed orders.     The Patient requires high complexity decision making for assessment and support, frequent evaluation and titration of therapies, application of advanced monitoring technologies and extensive interpretation of multiple databases.   Herbon Fleming,M.D. Pulmonary & Critical care Medicine [facility]

## 2015-11-03 ENCOUNTER — Emergency Department
Admission: EM | Admit: 2015-11-03 | Discharge: 2015-11-03 | Disposition: A | Discharge status: Home / Self Care | Payer: Self-pay | Attending: Emergency Medicine | Admitting: Emergency Medicine

## 2015-11-03 DIAGNOSIS — R55 Syncope and collapse: Secondary | ICD-10-CM | POA: Insufficient documentation

## 2015-11-03 DIAGNOSIS — F17200 Nicotine dependence, unspecified, uncomplicated: Secondary | ICD-10-CM | POA: Insufficient documentation

## 2015-11-03 DIAGNOSIS — Z79899 Other long term (current) drug therapy: Secondary | ICD-10-CM | POA: Insufficient documentation

## 2015-11-03 LAB — BASIC METABOLIC PANEL
Anion gap: 6 (ref 5–15)
BUN: 9 mg/dL (ref 6–20)
CALCIUM: 8.5 mg/dL — AB (ref 8.9–10.3)
CO2: 26 mmol/L (ref 22–32)
CREATININE: 1.11 mg/dL (ref 0.61–1.24)
Chloride: 105 mmol/L (ref 101–111)
GFR calc non Af Amer: >60 mL/min (ref >60)
Glucose, Bld: 132 mg/dL — ABNORMAL HIGH (ref 65–99)
Potassium: 3.8 mmol/L (ref 3.5–5.1)
SODIUM: 137 mmol/L (ref 135–145)

## 2015-11-03 LAB — URINALYSIS COMPLETE WITH MICROSCOPIC (ARMC ONLY)
Bilirubin Urine: NEGATIVE
GLUCOSE, UA: NEGATIVE mg/dL
Hgb urine dipstick: NEGATIVE
Ketones, ur: NEGATIVE mg/dL
Leukocytes, UA: NEGATIVE
Nitrite: NEGATIVE
PROTEIN: NEGATIVE mg/dL
SPECIFIC GRAVITY, URINE: 1.016 (ref 1.005–1.030)
pH: 5 (ref 5.0–8.0)

## 2015-11-03 LAB — CBC
HCT: 44.8 % (ref 40.0–52.0)
Hemoglobin: 15 g/dL (ref 13.0–18.0)
MCH: 30.4 pg (ref 26.0–34.0)
MCHC: 33.5 g/dL (ref 32.0–36.0)
MCV: 90.8 fL (ref 80.0–100.0)
PLATELETS: 241 10*3/uL (ref 150–440)
RBC: 4.93 MIL/uL (ref 4.40–5.90)
RDW: 13.5 % (ref 11.5–14.5)
WBC: 15.9 10*3/uL — AB (ref 3.8–10.6)

## 2015-11-03 LAB — CULTURE, BLOOD (ROUTINE X 2)
Culture: NO GROWTH
Culture: NO GROWTH

## 2015-11-03 MED ORDER — SODIUM CHLORIDE 0.9 % IV BOLUS (SEPSIS)
1000.0000 mL | Freq: Once | INTRAVENOUS | Status: AC
  Administered 2015-11-03: 1000 mL via INTRAVENOUS

## 2015-11-03 NOTE — ED Provider Notes (Signed)
[facility] Emergency Department Provider Note  Time seen: 1:40 PM  I have reviewed the triage vital signs and the nursing notes.   HISTORY  Chief Complaint Loss of Consciousness    HPI Joshua Clay is a 60 y.o. male presents to the emergency department for sickle cell upset. According to the patient he donated plasma twice this week including just prior to his syncopal episode. Patient states he donated plasma, left the donation center went to a grocery store and passed out while shopping. They did not eat or drink in anything since donating. Patient states he has donated plasma twice in a week before without passing out. Patient denies any chest pain at any time, or at any time today. Patient states he is feeling normal. Per EMS they state the patient stood up to get in the ambulance, laid down on the stretcher began feeling lightheaded and had approximately 20-30 seconds of jerking-like movements, but did not completely pass out. Patient has no history of seizures in the past.  History reviewed. No pertinent past medical history.  Patient Active Problem List   Diagnosis Date Noted  . Pulmonary emboli (HCC) 10/29/2015    Past Surgical History:  Procedure Laterality Date  . HERNIA REPAIR      Prior to Admission medications   Medication Sig Start Date End Date Taking? Authorizing Provider  acetaminophen (TYLENOL) 500 MG tablet Take 500 mg by mouth every 6 (six) hours as needed.    Historical Provider, MD  apixaban (ELIQUIS) 5 MG TABS tablet Take 2 tablets (10 mg total) by mouth 2 (two) times daily. 10/30/15 11/04/15  Adrian SaranSital Mody, MD  apixaban (ELIQUIS) 5 MG TABS tablet Take 1 tablet (5 mg total) by mouth 2 (two) times daily. 11/05/15   Adrian SaranSital Mody, MD  levofloxacin (LEVAQUIN) 750 MG tablet Take 1 tablet (750 mg total) by mouth daily. 10/30/15   Adrian SaranSital Mody, MD  nicotine (NICODERM CQ) 21 mg/24hr patch Place 1 patch (21 mg total) onto the skin daily. 10/30/15    Adrian SaranSital Mody, MD    No Known Allergies  No family history on file.  Social History Social History  Substance Use Topics  . Smoking status: Current Every Day Smoker    Packs/day: 1.00    Years: 15.00  . Smokeless tobacco: Never Used  . Alcohol use No    Review of Systems Constitutional: Negative for fever. Cardiovascular: Negative for chest pain. Respiratory: Negative for shortness of breath. Gastrointestinal: Negative for abdominal pain Neurological: Negative for headache 10-point ROS otherwise negative.  ____________________________________________   PHYSICAL EXAM:  VITAL SIGNS: ED Triage Vitals  Enc Vitals Group     BP 11/03/15 1336 (!) 103/7     Pulse Rate 11/03/15 1336 76     Resp 11/03/15 1336 20     Temp 11/03/15 1336 97.7 F (36.5 C)     Temp Source 11/03/15 1336 Oral     SpO2 11/03/15 1336 97 %     Weight 11/03/15 1332 165 lb (74.8 kg)     Height 11/03/15 1332 6\' 1"  (1.854 m)     Head Circumference --      Peak Flow --      Pain Score 11/03/15 1332 0     Pain Loc --      Pain Edu? --      Excl. in GC? --     Constitutional: Alert and oriented. Well appearing and in no distress. Eyes: Normal exam ENT   Head:  Normocephalic and atraumatic.   Mouth/Throat: Mucous membranes are moist. Cardiovascular: Normal rate, regular rhythm. No murmur Respiratory: Normal respiratory effort without tachypnea nor retractions. Breath sounds are clear Gastrointestinal: Soft and nontender. No distention. Musculoskeletal: Nontender with normal range of motion in all extremities.  Neurologic:  Normal speech and language. No gross focal neurologic deficits Skin:  Skin is warm, dry and intact.  Psychiatric: Mood and affect are normal.  ____________________________________________    EKG  EKG reviewed and interpreted, so shows normal sinus rhythm at 84 bpm, narrow QRS, normal axis, normal intervals, nonspecific ST changes, no ST  elevations.  ____________________________________________    INITIAL IMPRESSION / ASSESSMENT AND PLAN / ED COURSE  Pertinent labs & imaging results that were available during my care of the patient were reviewed by me and considered in my medical decision making (see chart for details).  The patient presents to the emergency department after a syncopal episode. Patient donated plasma this morning, highly suspect this is the cause of the patient's syncopal episode. We will check labs, IV hydrate and closely monitor in the emergency department. The patient's seizure-like activity in the ambulance was likely results of hypotension with hypoxic jerking as the patient did not completely pass out, and has no history of seizures in the past. EMS states the patient had just stood up prior to this episode. Currently the patient appears well with a largely normal physical exam.  Labs are largely within normal limits. Urinalysis normal. Slight leukocytosis. Patient continues to feel very well. Has received IV fluids, has eaten. We'll discharge home with PCP follow-up.  ____________________________________________   FINAL CLINICAL IMPRESSION(S) / ED DIAGNOSES  Syncope    Minna Antis, MD 11/03/15 [phone]

## 2015-11-03 NOTE — ED Notes (Addendum)
Pt bib EMS w/ c/o [facility]opal episode at [facility].  Pt donated plasma today, reports second donation this week.  Denies CP, SOB, N/V/D, dizziness, weakness or fevers.  Pt was A/Ox4 on scene, EMS reported that in truck pt had 30 sec of "seizure like" activity.  Pt denies hx of seizures.  Pt A/Ox4, resp even and unlabored, skin tone WNL.  NAD

## 2017-10-20 IMAGING — CR DG CHEST 2V
1 series · 2 of 2 positions shown · non-contrast
Comparison: 03/25/2008

CLINICAL DATA: Left-sided chest pain.  Shortness of breath.  Cough.

EXAM:
CHEST  2 VIEW

[Series 1: dg chest 2 view · 0.14mm/px · 2 of 2 slices shown]
[im 1/2]
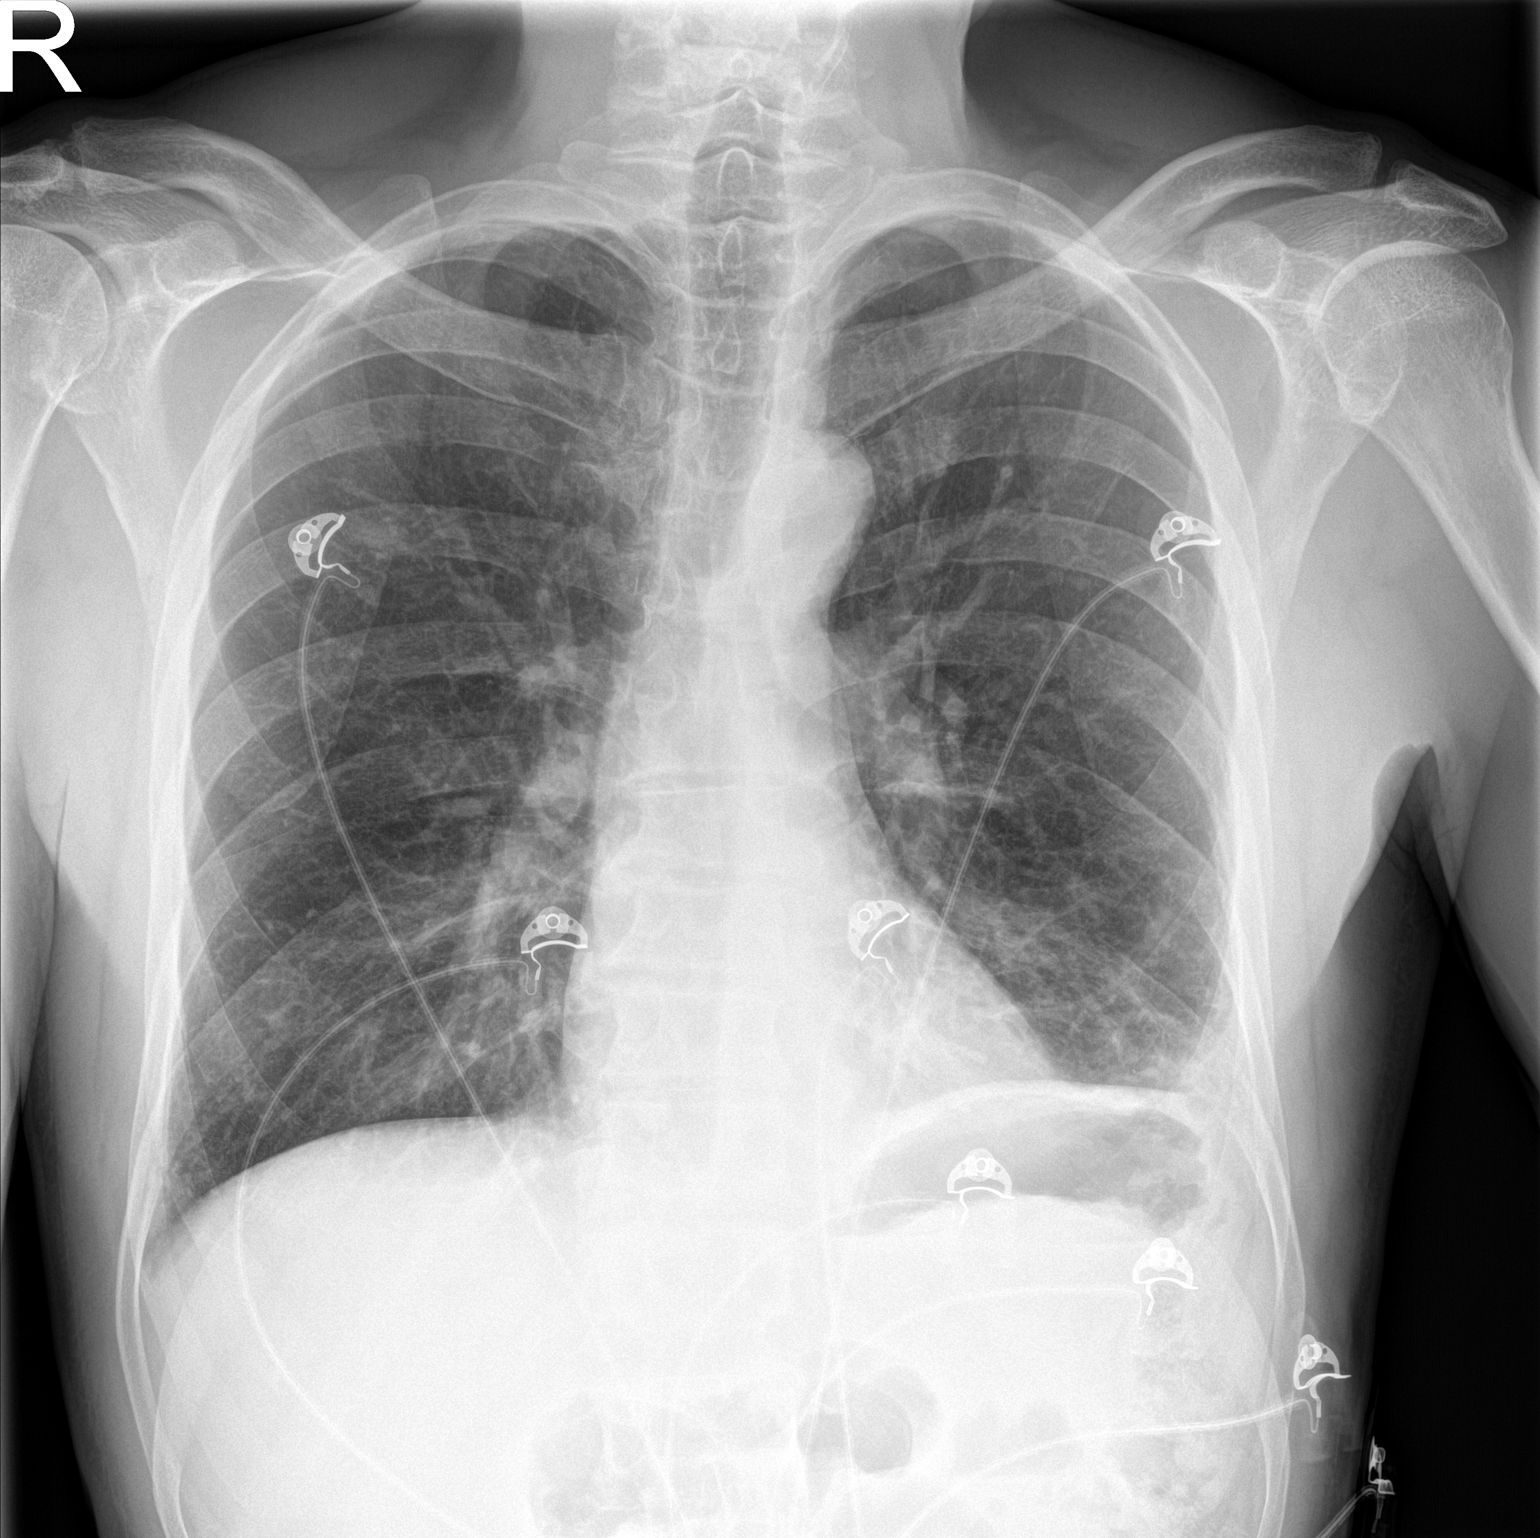
[im 2/2]
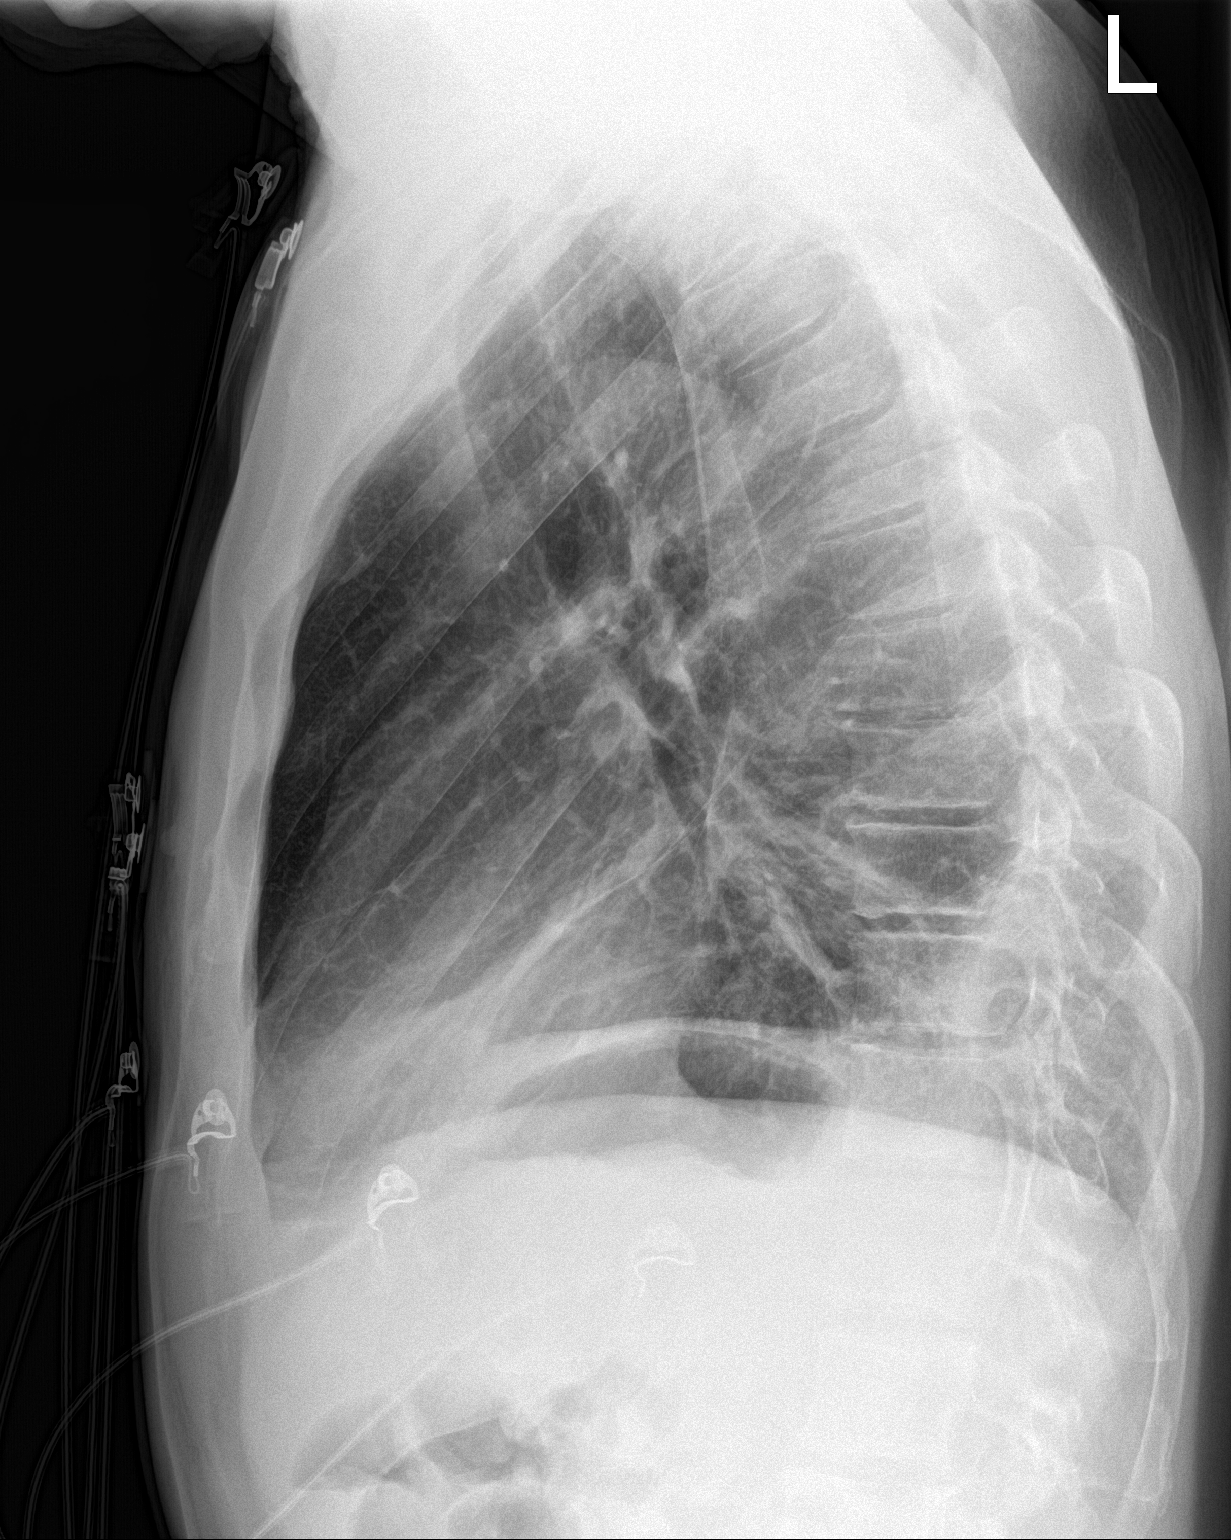

[2 of 2 positions shown; findings below may reference images not displayed]

FINDINGS: Peripheral left lung base opacity with small left pleural effusion.
Minimal lingular opacity. Right lung is clear. Normal heart size and
mediastinal contours. No pulmonary edema or pneumothorax. No acute
osseous abnormality.
IMPRESSION: Left lower lobe and lingular opacity with small left pleural
effusion.

## 2017-10-20 IMAGING — CT CT ANGIO CHEST
2 of 6 series · 18 of 46 positions shown · IV contrast (APPLIED)
Comparison: None.

CLINICAL DATA: 60-year-old male with a history of left-sided chest
pain and shortness of breath starting 2 days prior.

EXAM:
CT ANGIOGRAPHY CHEST WITH CONTRAST
TECHNIQUE: Multidetector CT imaging of the chest was performed using the
standard protocol during bolus administration of intravenous
contrast. Multiplanar CT image reconstructions and MIPs were
obtained to evaluate the vascular anatomy.
CONTRAST:  75 cc Isovue 370

[Series 5: thins · axial · 0.70mm/px · z∈[-696,-402]mm · 16 of 322 slices shown]
[im 14/322  lung]
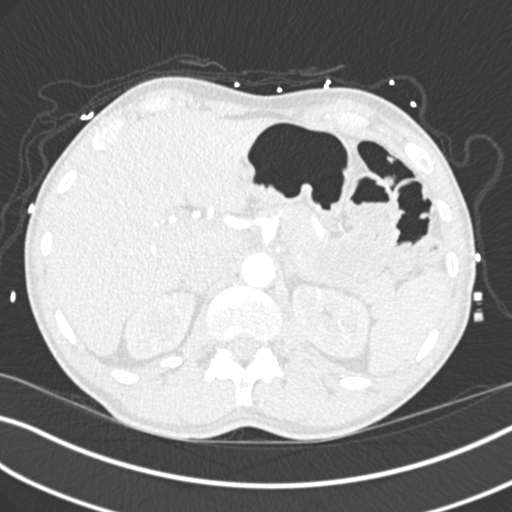
[im 42/322  soft-tissue]
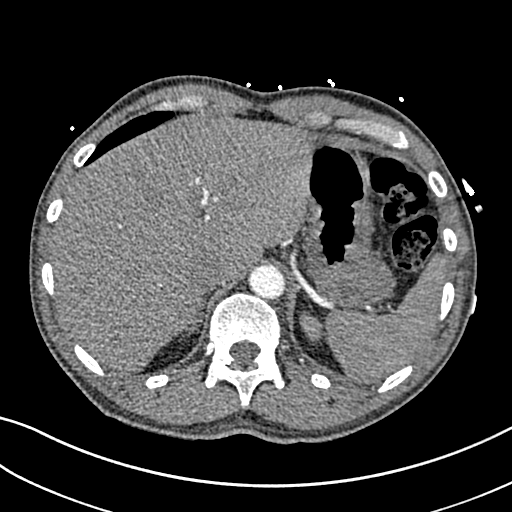
[im 56/322  lung]
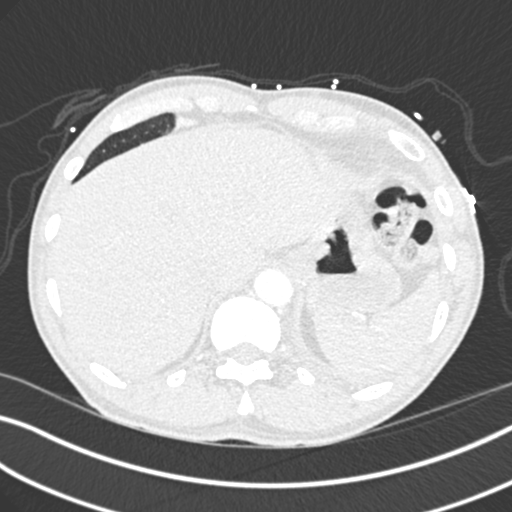
[im 70/322  soft-tissue]
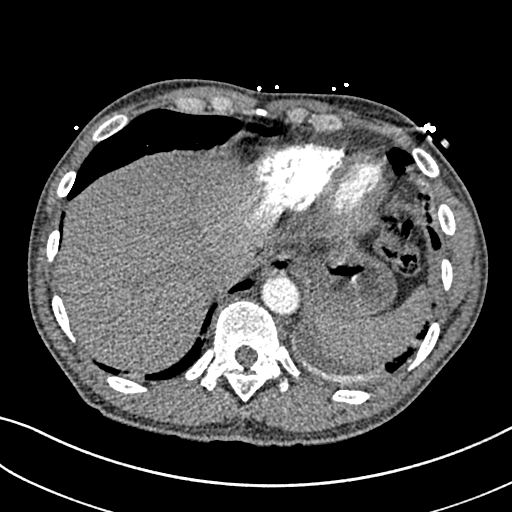
[im 98/322  lung]
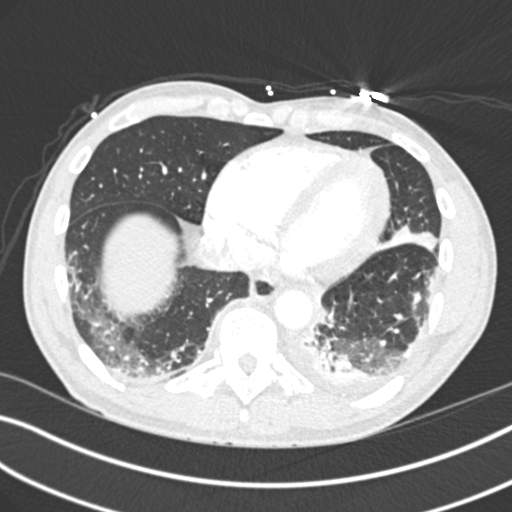
[im 112/322  soft-tissue]
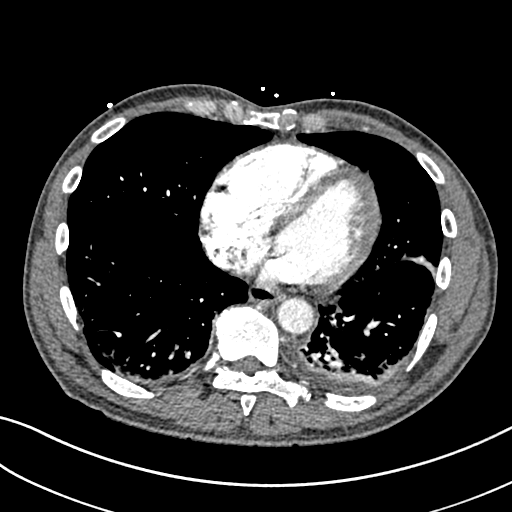
[im 126/322  lung]
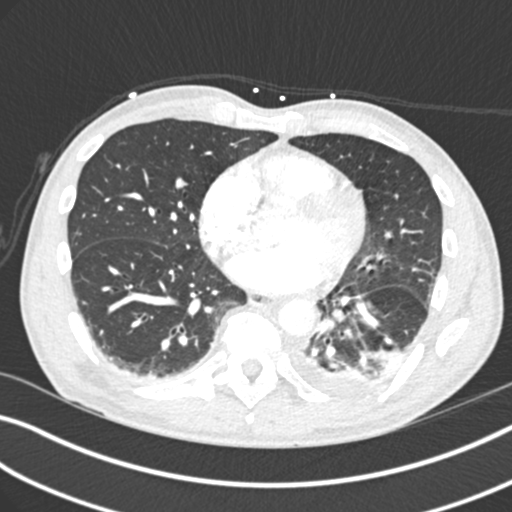
[im 154/322  soft-tissue]
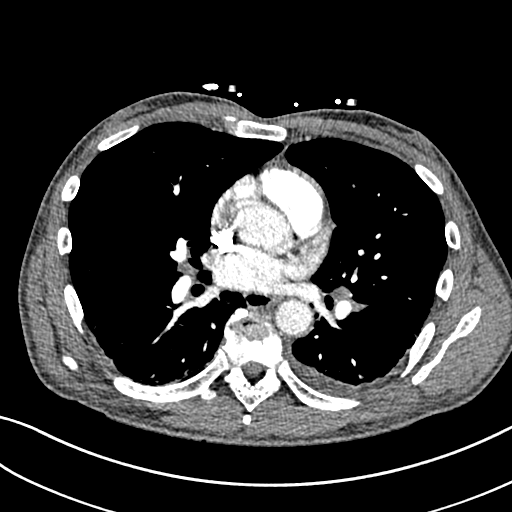
[im 168/322  lung]
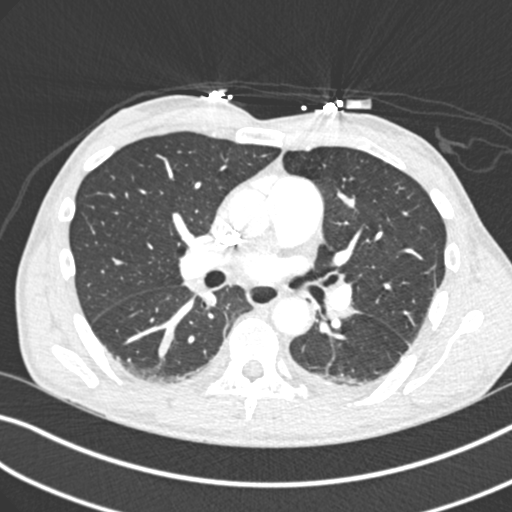
[im 196/322  soft-tissue]
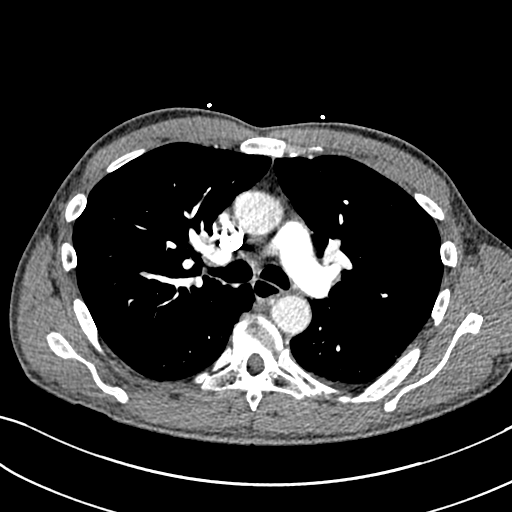
[im 210/322  lung]
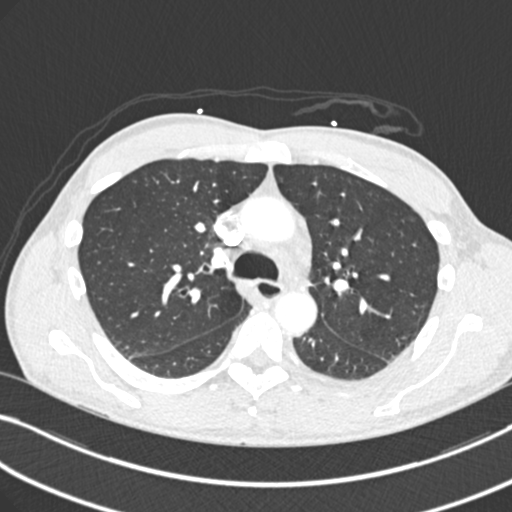
[im 224/322  soft-tissue]
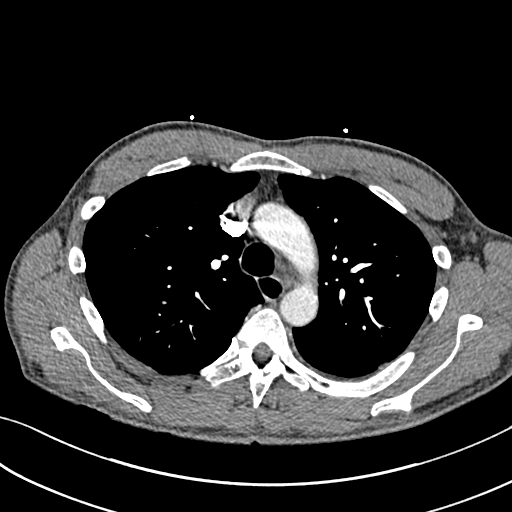
[im 252/322  lung]
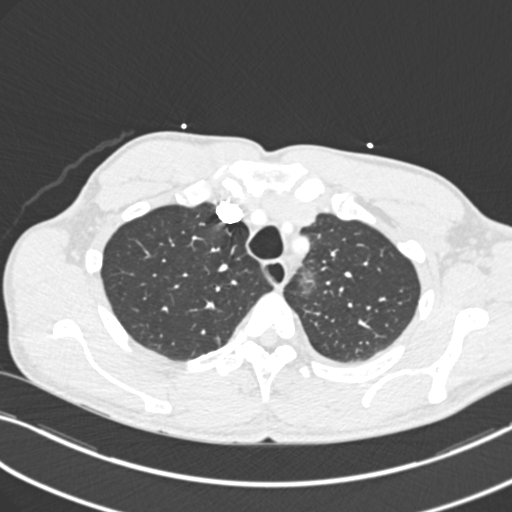
[im 266/322  soft-tissue]
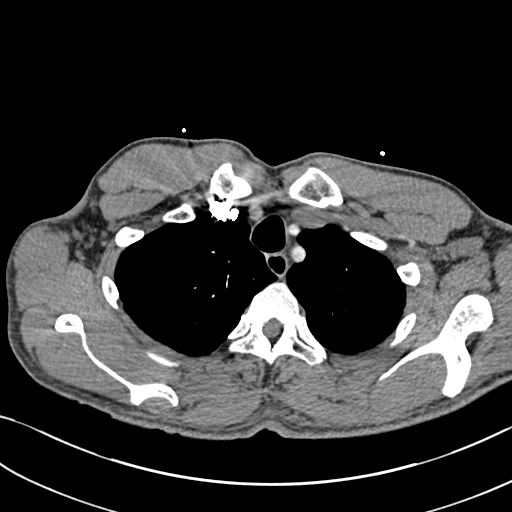
[im 280/322  lung]
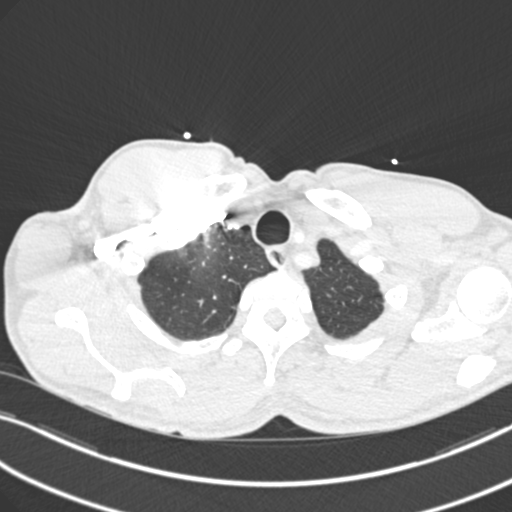
[im 308/322  soft-tissue]
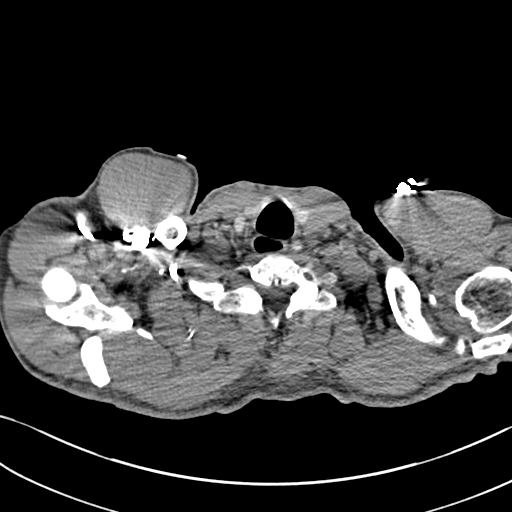

[Series 7: coronal mpr · coronal · 0.64mm/px · 2 of 82 slices shown]
[im 28/82  soft-tissue]
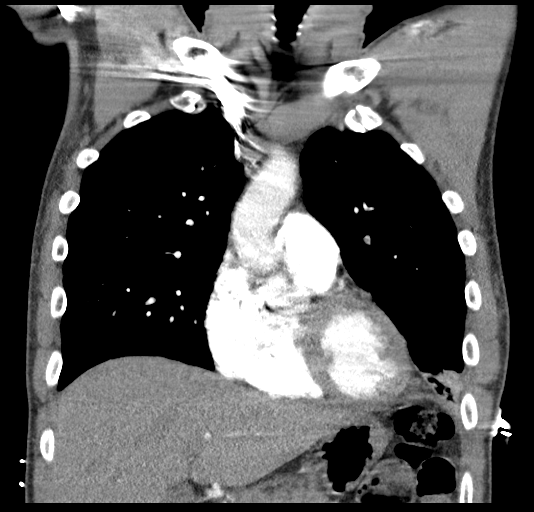
[im 55/82  soft-tissue]
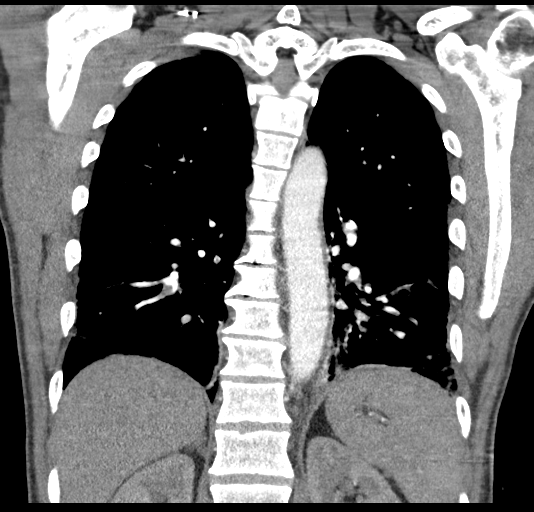

[18 of 46 positions shown; findings below may reference images not displayed]

FINDINGS: Cardiovascular:

Heart:

No cardiomegaly. No pericardial fluid/thickening. No significant
coronary calcifications.

Aorta:

Unremarkable course, caliber, contour of the thoracic aorta. No
aneurysm or dissection flap. No periaortic fluid. Minimal soft
plaque with no calcifications.

Pulmonary arteries:

Small volume segmental and subsegmental pulmonary emboli the
bilateral lower lobes. Minimal subsegmental volume of the upper
lobes.

Mediastinum/Nodes: Mediastinal lymph nodes are present, none of
which are enlarged by CT size criteria. Unremarkable appearance of
the thoracic esophagus.

Unremarkable appearance of the thoracic inlet and thyroid.

Lungs/Pleura: Mixed ground-glass and nodular opacity of the left
lower lobe with associated small pleural effusion.
Atelectasis/scarring of the lingula. Nodular opacity at the right
base within the costophrenic sulcus with associated ground-glass
opacity.

No pneumothorax.

Upper Abdomen: Unremarkable.

Musculoskeletal: No displaced fracture. Degenerative changes of the
spine.

Review of the MIP images confirms the above findings.
IMPRESSION: Study is positive for small volume bilateral pulmonary emboli
involving segmental and subsegmental vessels of mainly the lower
lobes. There is associated mixed ground-glass and nodular opacities
of the lungs compatible with developing atelectasis/infarction, with
small left-sided pleural effusion.

These results were called by telephone at the time of interpretation
on 10/29/2015 at [DATE] to Dr. Domingos De Almeida, who verbally acknowledged
these results.

## 2018-12-23 ENCOUNTER — Other Ambulatory Visit: Payer: Self-pay

## 2018-12-23 DIAGNOSIS — Z20822 Contact with and (suspected) exposure to covid-19: Secondary | ICD-10-CM

## 2018-12-27 LAB — NOVEL CORONAVIRUS, NAA: SARS-CoV-2, NAA: DETECTED — AB

## 2018-12-29 ENCOUNTER — Telehealth: Payer: Self-pay | Admitting: Nurse Practitioner

## 2018-12-29 NOTE — Telephone Encounter (Signed)
Called to Discuss with patient about Covid symptoms and the use of bamlanivimab, a monoclonal antibody infusion for those with mild to moderate Covid symptoms and at a high risk of hospitalization.     Pt is qualified for this infusion at the [facility] infusion center due to co-morbid conditions and/or a member of an at-risk group.     Patient states that he is mch improved and no longer having symptoms.

## 2023-01-17 NOTE — ED Triage Notes (Signed)
 Patient presents to the ED c/o right elbow pain that began last week. Mild swelling possibly noted. Very tender to touch. Pt states he can't extend elbow.  Denies any injury to elbow. Patient also notes a feeling like his flap in throat is choking him, (epiglottis?) x 1 week. Notes hx of stage 4 prostate cancer and currently undergoing tx, pills (chemo?)

## 2023-01-17 NOTE — ED Provider Notes (Signed)
 [facility] Emergency Department Provider Note    ED Clinical Impression   Final diagnoses:  Tonsillitis (Primary)  Effusion of right elbow    ED Assessment/Plan  Joshua Clay 68 y.o. patient who has a past medical history of prostate cancer, BPH, PE, DVT, hernia repair he presents with right elbow pain and reduced range of motion of the right elbow that onset last night in the setting of sleeping on the elbow last night.   On exam, the patient appears in no acute distress. VS are within normal limits.    Differential includes low suspiscion for septic joint but will get basic labs, CRP, and Sed rate to further assess.  Plan for XR Right Elbow, BMP, CRP, Sed Rate, CBC, and arthrocentesis of right elbow to send fluid to lab for further evaluation. Will refer to ENT and orthopedics.   Discussion of Management with other Physicians, QHP or Appropriate Source:  N/A Independent Interpretation of Studies: EKG  N/A; RAD  N/A, POCUS  N/A External Records Reviewed: Patient's most recent outside Emergency Department visit 11/05/2017 [facility] Provider Note for patient's past medical history Escalation of Care, Consideration of Admission/Observation/Transfer:  N/A Social determinants that significantly affected care: None applicable Prescription drug(s) considered but not prescribed:  Diagnostic tests considered but not performed:  History obtained from other sources: None  Medical Decision Making   ED Course as of 01/23/23 1147  Thu Jan 17, 2023  1846 Pt with leukocytosis of 14.0. However low suspicion for septic joint as there is no erythema or warmth to the joint. Likewise, pt afebrile with no history of instrumentation or joint replacement. Pt leukocytosis likely due to URI and tonsillitis. Denies concerns for STI or history of gonorrhea.  4:45 PM XR elbow concerning for : 1.  An elbow joint effusion is present. This is of indeterminate sterility and  composition. No acute fracture identified radiographically. 2.  No joint malalignment. 3.  Osteopenia. 4.  Mild degenerative changes. 5.  Olecranon enthesophyte at the triceps insertion.  Will treat effusion with prednisone. Will also give course of antibiotics since pt does have tonsillar enlargement with sore throat as well. Will give referral from ENT and Ortho for follow up. Pt in agreement with plan.       History   Chief Complaint  Patient presents with  . Elbow Pain   HPI  Joshua Clay is a 68 y.o. male with a past medical history of prostate cancer, BPH, PE, DVT, and hernia repair who presents with elbow pain. The patient reports right elbow pain and reduced range of motion of the right elbow that onset last night in the setting of sleeping on the elbow last night. He took Advil with minimal relief of his pain. No known history of gout. No history of similar symptoms. No injury or trauma to the elbow. He further reports 2 weeks of a sensation of feeling like he is swallowing the flap in the back of his throat. Denies sore throat or pain.  Past Medical History:  Diagnosis Date  . BPH (benign prostatic hyperplasia)   . DVT (deep venous thrombosis) (CMS-HCC)   . Pulmonary embolism (CMS-HCC)     Past Surgical History:  Procedure Laterality Date  . HERNIA REPAIR      No family history on file.  Social History   Socioeconomic History  . Marital status: Married  Tobacco Use  . Smoking status: Former    Current packs/day: 0.00  Types: Cigarettes    Quit date: 09/05/2016    Years since quitting: 6.3  . Smokeless tobacco: Never  Substance and Sexual Activity  . Alcohol use: No    Comment: occasional  . Drug use: No    No current facility-administered medications for this encounter.   Current Outpatient Medications  Medication Sig Dispense Refill  . amoxicillin (AMOXIL) 500 MG capsule Take 1 capsule (500 mg total) by mouth two (2) times a day for 10  days. 20 capsule 0  . rivaroxaban 15 mg (42)- 20 mg (9) DsPk Take 1 tablet by mouth Take as directed. Then continue 20mg  daily thereafter. (Patient taking differently: Take 20 mg by mouth daily. ) 51 tablet 2      Physical Exam   BP 153/89   Pulse 69   Temp 36.6 C (97.8 F) (Oral)   Resp 18   SpO2 98%    Physical Exam Constitutional:      General: No acute distress.    Appearance: Normal appearance. Not ill-appearing or toxic-appearing.  HENT:     Head: Normocephalic and atraumatic.  Eyes:     Extraocular Movements: Extraocular movements intact.     Conjunctiva/sclera: Conjunctivae normal.     Pupils: Pupils are equal, round, and reactive to light.  Cardiovascular:     Rate and Rhythm: Normal rate and regular rhythm.     Pulses: Normal pulses.     Heart sounds: Normal heart sounds.  Pulmonary:     Effort: Pulmonary effort is normal.     Breath sounds: Normal breath sounds.  Abdominal:     General: Bowel sounds are normal. There is no distension.     Palpations: Abdomen is soft. There is no mass.     Tenderness: There is no abdominal tenderness. There is no right CVA tenderness, left CVA tenderness, guarding or rebound.     Hernia: No hernia is present.  Musculoskeletal:        General: No swelling, tenderness or signs of injury. Normal range of motion.     Right lower leg: No edema.     Left lower leg: No edema.  Skin:    General: Skin is warm and dry.     Capillary Refill: Capillary refill takes less than 2 seconds.     Coloration: Skin is not jaundiced or pale.     Findings: No bruising, erythema, lesion or rash.  Neurological:     General: No focal deficit present.     Mental Status: She is alert and oriented to person, place, and time.     Cranial Nerves: No cranial nerve deficit.     Sensory: No sensory deficit.     Motor: No weakness.     Coordination: Coordination normal.     Gait: Gait normal.  Psychiatric:        Mood and Affect: Mood normal.         Behavior: Behavior normal.        Thought Content: Thought content normal.        Judgment: Judgment normal.       Documentation assistance was provided by Recardo Bile, Scribe, on January 17, 2023 at 5:17 PM for Lyndell Mace, [place].  Documentation assistance was provided by the scribe in my presence.  The documentation recorded by the scribe has been reviewed by me and accurately reflects the services I personally performed.     Mace Lyndell [place], [place] 01/23/23 1148

## 2023-07-03 ENCOUNTER — Other Ambulatory Visit: Payer: Self-pay | Admitting: Hematology and Oncology

## 2023-07-03 DIAGNOSIS — C8310 Mantle cell lymphoma, unspecified site: Secondary | ICD-10-CM

## 2023-07-15 ENCOUNTER — Ambulatory Visit: Admission: RE | Admit: 2023-07-15 | Source: Ambulatory Visit

## 2023-07-22 ENCOUNTER — Ambulatory Visit
Admission: RE | Admit: 2023-07-22 | Discharge: 2023-07-22 | Disposition: A | Discharge status: Home / Self Care | Source: Ambulatory Visit | Attending: Hematology and Oncology | Admitting: Hematology and Oncology

## 2023-07-22 DIAGNOSIS — I700 Atherosclerosis of aorta: Secondary | ICD-10-CM | POA: Diagnosis not present

## 2023-07-22 DIAGNOSIS — Z8546 Personal history of malignant neoplasm of prostate: Secondary | ICD-10-CM | POA: Insufficient documentation

## 2023-07-22 DIAGNOSIS — C8310 Mantle cell lymphoma, unspecified site: Secondary | ICD-10-CM | POA: Insufficient documentation

## 2023-07-22 LAB — GLUCOSE, CAPILLARY: Glucose-Capillary: 103 mg/dL — ABNORMAL HIGH (ref 70–99)

## 2023-07-22 MED ORDER — FLUDEOXYGLUCOSE F - 18 (FDG) INJECTION
8.6000 | Freq: Once | INTRAVENOUS | Status: AC | PRN
  Administered 2023-07-22: 9.29 via INTRAVENOUS
